# Patient Record
Sex: Male | Born: 1975 | Race: Black or African American | Hispanic: No | Marital: Single | Smoking: Current every day smoker
Health system: Southern US, Community
[De-identification: ages and names within clinical notes are randomized; demographics above are authoritative.]

## PROBLEM LIST (undated history)

## (undated) DIAGNOSIS — I219 Acute myocardial infarction, unspecified: Secondary | ICD-10-CM

## (undated) DIAGNOSIS — I38 Endocarditis, valve unspecified: Secondary | ICD-10-CM

## (undated) DIAGNOSIS — I341 Nonrheumatic mitral (valve) prolapse: Secondary | ICD-10-CM

## (undated) HISTORY — PX: OTHER SURGICAL HISTORY: SHX169

---

## 2014-08-17 ENCOUNTER — Encounter (HOSPITAL_COMMUNITY): Payer: Self-pay | Admitting: Emergency Medicine

## 2014-08-17 ENCOUNTER — Emergency Department (HOSPITAL_COMMUNITY)
Admission: EM | Admit: 2014-08-17 | Discharge: 2014-08-17 | Disposition: A | Discharge status: Home / Self Care | Attending: Emergency Medicine | Admitting: Emergency Medicine

## 2014-08-17 DIAGNOSIS — H608X2 Other otitis externa, left ear: Secondary | ICD-10-CM | POA: Insufficient documentation

## 2014-08-17 DIAGNOSIS — H6502 Acute serous otitis media, left ear: Secondary | ICD-10-CM

## 2014-08-17 DIAGNOSIS — Z8679 Personal history of other diseases of the circulatory system: Secondary | ICD-10-CM | POA: Insufficient documentation

## 2014-08-17 DIAGNOSIS — H9202 Otalgia, left ear: Secondary | ICD-10-CM | POA: Diagnosis present

## 2014-08-17 DIAGNOSIS — H6092 Unspecified otitis externa, left ear: Secondary | ICD-10-CM

## 2014-08-17 HISTORY — DX: Endocarditis, valve unspecified: I38

## 2014-08-17 MED ORDER — IBUPROFEN 800 MG PO TABS: 800.0000 mg | ORAL_TABLET | Freq: Three times a day (TID) | ORAL | Status: DC

## 2014-08-17 MED ORDER — IBUPROFEN 400 MG PO TABS
600.0000 mg | ORAL_TABLET | Freq: Once | ORAL | Status: AC
Start: 2014-08-17 — End: 2014-08-17
  Administered 2014-08-17: 600 mg via ORAL
  Filled 2014-08-17: qty 2

## 2014-08-17 MED ORDER — AMOXICILLIN 250 MG PO CAPS
500.0000 mg | ORAL_CAPSULE | Freq: Once | ORAL | Status: AC
  Administered 2014-08-17: 500 mg via ORAL
  Filled 2014-08-17: qty 2

## 2014-08-17 MED ORDER — AMOXICILLIN 500 MG PO CAPS: 500.0000 mg | ORAL_CAPSULE | Freq: Three times a day (TID) | ORAL | Status: DC

## 2014-08-17 MED ORDER — NEOMYCIN-COLIST-HC-THONZONIUM 3.3-3-10-0.5 MG/ML OT SUSP
4.0000 [drp] | Freq: Four times a day (QID) | OTIC | Status: DC
  Administered 2014-08-17: 4 [drp] via OTIC
  Filled 2014-08-17: qty 5

## 2014-08-17 NOTE — ED Provider Notes (Signed)
CSN: [id number]     Arrival date & time 08/17/14  2035 History   First MD Initiated Contact with Patient 08/17/14 2036     Chief Complaint  Patient presents with  . Otalgia     (Consider location/radiation/quality/duration/timing/severity/associated sxs/prior Treatment) Patient is a 39 y.o. male presenting with ear pain. The history is provided by the patient.  Otalgia Location:  Left Quality:  Aching Severity:  Moderate Onset quality:  Gradual Duration:  1 day Timing:  Constant Progression:  Worsening Chronicity:  New Worsened by:  Nothing tried Ineffective treatments:  None tried  Lisa Blakeman is a 39 y.o. male who presents to the ED with officer from [facility] with complaint of left ear pain that started today.  Past Medical History  Diagnosis Date  . Heart valve disease    Past Surgical History  Procedure Laterality Date  . Head surgery     History reviewed. No pertinent family history. Social History  Substance Use Topics  . Smoking status: Never Smoker   . Smokeless tobacco: None  . Alcohol Use: No    Review of Systems  HENT: Positive for ear pain.   all other systems negative    Allergies  Review of patient's allergies indicates no known allergies.  Home Medications   Prior to Admission medications   Medication Sig Start Date End Date Taking? Authorizing Provider  amoxicillin (AMOXIL) 500 MG capsule Take 1 capsule (500 mg total) by mouth 3 (three) times daily. 08/17/14   Amalie Koran Orlene Och, NP  ibuprofen (ADVIL,MOTRIN) 800 MG tablet Take 1 tablet (800 mg total) by mouth 3 (three) times daily. 08/17/14   Seini Lannom Orlene Och, NP   BP 120/86 mmHg  Pulse 72  Temp(Src) 97.7 F (36.5 C) (Oral)  Resp 14  Ht  (1.753 m)  Wt 175 lb (79.379 kg)  BMI 25.83 kg/m2  SpO2 99% Physical Exam  Constitutional: He is oriented to person, place, and time. He appears well-developed and well-nourished.  HENT:  Head: Normocephalic and atraumatic.  Right Ear:  Tympanic membrane normal.  Left Ear: There is drainage, swelling and tenderness. Tympanic membrane is erythematous.  Nose: Nose normal.  Mouth/Throat: Uvula is midline, oropharynx is clear and moist and mucous membranes are normal.  Eyes: Conjunctivae and EOM are normal. Pupils are equal, round, and reactive to light.  Neck: Neck supple.  Cardiovascular: Normal rate and regular rhythm.   Pulmonary/Chest: Effort normal and breath sounds normal.  Abdominal: Soft. There is no tenderness.  Musculoskeletal: Normal range of motion.  Neurological: He is alert and oriented to person, place, and time. No cranial nerve deficit.  Skin: Skin is warm and dry.  Psychiatric: He has a normal mood and affect. His behavior is normal.  Nursing note and vitals reviewed.   ED Course  Procedures (including critical care time) Labs Review Cortisporin Otic susp. Amoxicillin, Ibuprofen MDM  39 y.o. male with left ear pain that started today. Stable for d/c without fever and does not appear toxic. Will treat with cortisporin otic susp with first dose instilled here in the ED. Also start amoxicillin and ibuprofen he will return as needed.   Final diagnoses:  Acute serous otitis media of left ear, recurrence not specified  Otitis externa, left       [facility], NP 08/17/14 2120  Gilda Crease, MD 08/17/14 2126

## 2014-08-17 NOTE — ED Notes (Signed)
Patient is inmate brought in by [facility] with complaint of pain in left ear starting today.

## 2014-08-17 NOTE — ED Notes (Addendum)
Report given to [facility],  Ear drops given to officer

## 2017-05-14 ENCOUNTER — Ambulatory Visit: Payer: Self-pay | Admitting: Podiatry

## 2017-05-29 ENCOUNTER — Ambulatory Visit: Payer: Self-pay | Admitting: Podiatry

## 2017-05-29 ENCOUNTER — Encounter: Payer: Self-pay | Admitting: Podiatry

## 2017-05-29 VITALS — BP 124/71 | HR 63 | Ht 69.0 in | Wt 175.0 lb

## 2017-05-29 DIAGNOSIS — B351 Tinea unguium: Secondary | ICD-10-CM | POA: Insufficient documentation

## 2017-05-29 DIAGNOSIS — L600 Ingrowing nail: Secondary | ICD-10-CM

## 2017-05-29 NOTE — Progress Notes (Signed)
SUBJECTIVE: 42 y.o. year old male presents complaining of problematic right great toe nails x a year.  Review of Systems  Constitutional: Negative.   HENT: Negative.   Eyes: Negative.   Respiratory: Negative.   Cardiovascular:       Prolapsed Mitral valve.  Gastrointestinal: Negative.   Genitourinary: Negative.   Musculoskeletal: Negative.   Skin: Negative.      OBJECTIVE: DERMATOLOGIC EXAMINATION: Discolored and dystrophic right great toe nail with pain in closed in shoes.  VASCULAR EXAMINATION OF LOWER LIMBS: All pedal pulses are palpable with normal pulsation.  No edema or erythema noted. Temperature gradient from tibial crest to dorsum of foot is within normal bilateral.  NEUROLOGIC EXAMINATION OF THE LOWER LIMBS: All epicritic and tactile sensations grossly intact. Sharp and Dull discriminatory sensations at the plantar ball of hallux is intact bilateral.   MUSCULOSKELETAL EXAMINATION: No gross deformities.  ASSESSMENT: Dystrophic mycotic nail right great toe. Ingrown right great toe nail.  PLAN: Reviewed findings and available treatment options. May benefit from total matrixectomy of right great toe. Patient will return for the procedure.

## 2017-05-29 NOTE — Patient Instructions (Signed)
Seen for painful nail right great toe. Noted of severe damaged and fungal infected great toe. May benefit from total nail matrixectomy of the right great toe. Return for office procedure.

## 2017-05-30 ENCOUNTER — Encounter: Payer: Self-pay | Admitting: Podiatry

## 2017-05-30 NOTE — Addendum Note (Signed)
Addended by: Minna Antis T on: 05/30/2017 10:25 AM   Modules accepted: Orders

## 2017-07-04 ENCOUNTER — Other Ambulatory Visit: Payer: Self-pay

## 2017-07-04 ENCOUNTER — Emergency Department (HOSPITAL_BASED_OUTPATIENT_CLINIC_OR_DEPARTMENT_OTHER)
Admission: EM | Admit: 2017-07-04 | Discharge: 2017-07-04 | Disposition: A | Discharge status: Home / Self Care | Payer: Self-pay | Attending: Emergency Medicine | Admitting: Emergency Medicine

## 2017-07-04 ENCOUNTER — Emergency Department (HOSPITAL_BASED_OUTPATIENT_CLINIC_OR_DEPARTMENT_OTHER): Payer: Self-pay

## 2017-07-04 ENCOUNTER — Encounter (HOSPITAL_BASED_OUTPATIENT_CLINIC_OR_DEPARTMENT_OTHER): Payer: Self-pay | Admitting: Emergency Medicine

## 2017-07-04 DIAGNOSIS — R310 Gross hematuria: Secondary | ICD-10-CM | POA: Insufficient documentation

## 2017-07-04 DIAGNOSIS — R1032 Left lower quadrant pain: Secondary | ICD-10-CM | POA: Insufficient documentation

## 2017-07-04 DIAGNOSIS — N50819 Testicular pain, unspecified: Secondary | ICD-10-CM | POA: Insufficient documentation

## 2017-07-04 DIAGNOSIS — R109 Unspecified abdominal pain: Secondary | ICD-10-CM

## 2017-07-04 LAB — CBC WITH DIFFERENTIAL/PLATELET
Basophils Absolute: 0.1 10*3/uL (ref 0.0–0.1)
Basophils Relative: 1 %
Eosinophils Absolute: 0.2 10*3/uL (ref 0.0–0.7)
Eosinophils Relative: 3 %
HCT: 37.9 % — ABNORMAL LOW (ref 39.0–52.0)
HEMOGLOBIN: 12.7 g/dL — AB (ref 13.0–17.0)
LYMPHS ABS: 3.4 10*3/uL (ref 0.7–4.0)
LYMPHS PCT: 51 %
MCH: 29.1 pg (ref 26.0–34.0)
MCHC: 33.5 g/dL (ref 30.0–36.0)
MCV: 86.7 fL (ref 78.0–100.0)
MONOS PCT: 10 %
Monocytes Absolute: 0.6 10*3/uL (ref 0.1–1.0)
NEUTROS PCT: 37 %
Neutro Abs: 2.4 10*3/uL (ref 1.7–7.7)
Platelets: 231 10*3/uL (ref 150–400)
RBC: 4.37 MIL/uL (ref 4.22–5.81)
RDW: 12.9 % (ref 11.5–15.5)
WBC: 6.7 10*3/uL (ref 4.0–10.5)

## 2017-07-04 LAB — URINALYSIS, MICROSCOPIC (REFLEX)
Bacteria, UA: NONE SEEN
SQUAMOUS EPITHELIAL / LPF: NONE SEEN (ref 0–5)

## 2017-07-04 LAB — COMPREHENSIVE METABOLIC PANEL
ALBUMIN: 3.9 g/dL (ref 3.5–5.0)
ALK PHOS: 47 U/L (ref 38–126)
ALT: 12 U/L (ref 0–44)
AST: 22 U/L (ref 15–41)
Anion gap: 6 (ref 5–15)
BILIRUBIN TOTAL: 0.5 mg/dL (ref 0.3–1.2)
BUN: 14 mg/dL (ref 6–20)
CHLORIDE: 110 mmol/L (ref 98–111)
CO2: 27 mmol/L (ref 22–32)
Calcium: 8.9 mg/dL (ref 8.9–10.3)
Creatinine, Ser: 1.14 mg/dL (ref 0.61–1.24)
GFR calc Af Amer: >60 mL/min (ref >60)
Glucose, Bld: 93 mg/dL (ref 70–99)
POTASSIUM: 4 mmol/L (ref 3.5–5.1)
Sodium: 143 mmol/L (ref 135–145)
Total Protein: 6.9 g/dL (ref 6.5–8.1)

## 2017-07-04 LAB — URINALYSIS, ROUTINE W REFLEX MICROSCOPIC
Bilirubin Urine: NEGATIVE
GLUCOSE, UA: NEGATIVE mg/dL
Ketones, ur: NEGATIVE mg/dL
Leukocytes, UA: NEGATIVE
Nitrite: NEGATIVE
Protein, ur: NEGATIVE mg/dL
pH: 6 (ref 5.0–8.0)

## 2017-07-04 MED ORDER — CEFTRIAXONE SODIUM 250 MG IJ SOLR
250.0000 mg | Freq: Once | INTRAMUSCULAR | Status: AC
  Administered 2017-07-04: 250 mg via INTRAMUSCULAR
  Filled 2017-07-04: qty 250

## 2017-07-04 MED ORDER — TRAMADOL HCL 50 MG PO TABS: 50.0000 mg | ORAL_TABLET | Freq: Four times a day (QID) | ORAL | 0 refills | Status: AC | PRN

## 2017-07-04 MED ORDER — AZITHROMYCIN 250 MG PO TABS
1000.0000 mg | ORAL_TABLET | Freq: Once | ORAL | Status: AC
  Administered 2017-07-04: 1000 mg via ORAL
  Filled 2017-07-04: qty 4

## 2017-07-04 MED ORDER — KETOROLAC TROMETHAMINE 30 MG/ML IJ SOLN
30.0000 mg | Freq: Once | INTRAMUSCULAR | Status: AC
  Administered 2017-07-04: 30 mg via INTRAVENOUS
  Filled 2017-07-04: qty 1

## 2017-07-04 MED ORDER — IBUPROFEN 800 MG PO TABS: 800.0000 mg | ORAL_TABLET | Freq: Three times a day (TID) | ORAL | 0 refills | Status: AC | PRN

## 2017-07-04 NOTE — ED Triage Notes (Signed)
Pt states he is hurting in his private area and about an hour ago he started urinating blood

## 2017-07-04 NOTE — ED Provider Notes (Signed)
Emergency Department Provider Note   I have reviewed the triage vital signs and the nursing notes.   HISTORY  Chief Complaint Hematuria   HPI Edwin Morton is a 42 y.o. male with PMH of valvular disease but no prior h/o kidney stone resents to the emergency department after peeing blood and experiencing pain.  Patient states symptoms began this evening.  He has had intermittent pain in the penis as well as the left abdomen/flank.  He has no prior history of kidney stone.  He denies any fevers or shaking chills.  No prior history of hematuria.  No flulike symptoms.  Denies any urethral discharge.  No chest pain or dyspnea.  No modifying factors.  Pain is severe and intermittent.    Past Medical History:  Diagnosis Date  . Heart valve disease     Patient Active Problem List   Diagnosis Date Noted  . Onychomycosis 05/29/2017    Past Surgical History:  Procedure Laterality Date  . head surgery      Allergies Patient has no known allergies.  Family History  Problem Relation Age of Onset  . Cataracts Mother   . Hypertension Father     Social History Social History   Tobacco Use  . Smoking status: Current Every Day Smoker  . Smokeless tobacco: Never Used  Substance Use Topics  . Alcohol use: No  . Drug use: Yes    Types: Marijuana    Review of Systems  Constitutional: No fever/chills Eyes: No visual changes. ENT: No sore throat. Cardiovascular: Denies chest pain. Respiratory: Denies shortness of breath. Gastrointestinal: Positive LLQ abdominal/flank pain.  No nausea, no vomiting.  No diarrhea.  No constipation. Genitourinary: Negative for dysuria. Positive gross hematuria.  Musculoskeletal: Negative for back pain. Skin: Negative for rash. Neurological: Negative for headaches, focal weakness or numbness.  10-point ROS otherwise negative.  ____________________________________________   PHYSICAL EXAM:  VITAL SIGNS: ED Triage Vitals  Enc Vitals  Group     BP 07/04/17 0127 129/87     Pulse Rate 07/04/17 0127 65     Resp 07/04/17 0127 16     Temp 07/04/17 0127 98.1 F (36.7 C)     Temp Source 07/04/17 0127 Oral     SpO2 07/04/17 0127 100 %     Weight 07/04/17 0128 155 lb (70.3 kg)     Height 07/04/17 0128 5\' 6"  (1.676 m)     Pain Score 07/04/17 0128 8   Constitutional: Alert and oriented. Well appearing and in no acute distress. Eyes: Conjunctivae are normal. Head: Atraumatic. Nose: No congestion/rhinnorhea. Mouth/Throat: Mucous membranes are moist.  Neck: No stridor. Cardiovascular: Normal rate, regular rhythm. Good peripheral circulation. Grossly normal heart sounds.   Respiratory: Normal respiratory effort.  No retractions. Lungs CTAB. Gastrointestinal: Soft with tenderness to palpation over the LLQ. Positive left CVA tenderness. No distention. No urethral discharge. Mild diffuse testicular tenderness. No unilateral tenderness. No masses. No scrotal cellulitis.  Musculoskeletal: No lower extremity tenderness nor edema. No gross deformities of extremities. Neurologic:  Normal speech and language. No gross focal neurologic deficits are appreciated.  Skin:  Skin is warm, dry and intact. No rash noted.  ____________________________________________   LABS (all labs ordered are listed, but only abnormal results are displayed)  Labs Reviewed  URINALYSIS, ROUTINE W REFLEX MICROSCOPIC - Abnormal; Notable for the following components:      Result Value   Specific Gravity, Urine >1.030 (*)    Hgb urine dipstick TRACE (*)  All other components within normal limits  CBC WITH DIFFERENTIAL/PLATELET - Abnormal; Notable for the following components:   Hemoglobin 12.7 (*)    HCT 37.9 (*)    All other components within normal limits  URINALYSIS, MICROSCOPIC (REFLEX)  COMPREHENSIVE METABOLIC PANEL  GC/CHLAMYDIA PROBE AMP ([place]) [facility]   ____________________________________________  RADIOLOGY  Ct Renal Stone  Study  Result Date: 07/04/2017 CLINICAL DATA:  42 y/o  M; Flank pain, stone disease suspected. EXAM: CT ABDOMEN AND PELVIS WITHOUT CONTRAST TECHNIQUE: Multidetector CT imaging of the abdomen and pelvis was performed following the standard protocol without IV contrast. COMPARISON:  None. FINDINGS: Lower chest: No acute abnormality. Hepatobiliary: No focal liver abnormality is seen. No gallstones, gallbladder wall thickening, or biliary dilatation. Pancreas: Unremarkable. No pancreatic ductal dilatation or surrounding inflammatory changes. Spleen: Normal in size without focal abnormality. Adrenals/Urinary Tract: Adrenal glands are unremarkable. Kidneys are normal, without renal calculi, focal lesion, or hydronephrosis. Bladder is unremarkable. Stomach/Bowel: Stomach is within normal limits. Appendix appears normal. No evidence of bowel wall thickening, distention, or inflammatory changes. Vascular/Lymphatic: No significant vascular findings are present. No enlarged abdominal or pelvic lymph nodes. Reproductive: Prostate is unremarkable. Other: No abdominal wall hernia or abnormality. No abdominopelvic ascites. Musculoskeletal: No acute or significant osseous findings. IMPRESSION: No acute process identified. Unremarkable CT of the abdomen and pelvis. Electronically Signed   By: Mitzi HansenLance  Furusawa-Stratton M.D.   On: 07/04/2017 02:37    ____________________________________________   PROCEDURES  Procedure(s) performed:   Procedures  None ____________________________________________   INITIAL IMPRESSION / ASSESSMENT AND PLAN / ED COURSE  Pertinent labs & imaging results that were available during my care of the patient were reviewed by me and considered in my medical decision making (see chart for details).  Patient describes pain in the urethra as well as left flank with associated hematuria.  Symptoms are severe and intermittent.  Clinical suspicion for nephrolithiasis is increased.  UA shows no  evidence of infection but does show hemoglobin.  Plan for Toradol, labs, CT renal, reassess.   CT reviewed with no acute findings. Labs largely unremarkable. Patient now describes some concern for possible STD. He notes blood in the semen with ejaculation in addition to blood in the urine. Plan to send gonorrhea/chlamydia testing and treat empirically here. Provided contact information for Urology and placed an ambulatory referral from the ED. No concern clinically for torsion after exam.   At this time, I do not feel there is any life-threatening condition present. I have reviewed and discussed all results (EKG, imaging, lab, urine as appropriate), exam findings with patient. I have reviewed nursing notes and appropriate previous records.  I feel the patient is safe to be discharged home without further emergent workup. Discussed usual and customary return precautions. Patient and family (if present) verbalize understanding and are comfortable with this plan.  Patient will follow-up with their primary care provider. If they do not have a primary care provider, information for follow-up has been provided to them. All questions have been answered.  ____________________________________________  FINAL CLINICAL IMPRESSION(S) / ED DIAGNOSES  Final diagnoses:  Gross hematuria  Left flank pain     MEDICATIONS GIVEN DURING THIS VISIT:  Medications  ketorolac (TORADOL) 30 MG/ML injection 30 mg (30 mg Intravenous Given 07/04/17 0215)  azithromycin (ZITHROMAX) tablet 1,000 mg (1,000 mg Oral Given 07/04/17 0306)  cefTRIAXone (ROCEPHIN) injection 250 mg (250 mg Intramuscular Given 07/04/17 0307)     NEW OUTPATIENT MEDICATIONS STARTED DURING THIS VISIT:  New Prescriptions   IBUPROFEN (ADVIL,MOTRIN) 800 MG TABLET    Take 1 tablet (800 mg total) by mouth every 8 (eight) hours as needed.   TRAMADOL (ULTRAM) 50 MG TABLET    Take 1 tablet (50 mg total) by mouth every 6 (six) hours as needed.    Note:  This  document was prepared using Dragon voice recognition software and may include unintentional dictation errors.  Alona Bene, MD Emergency Medicine    Jaelene Garciagarcia, Arlyss Repress, MD 07/04/17 [phone]

## 2017-07-04 NOTE — Discharge Instructions (Signed)
You were seen in the ED today with blood in the urine. We did not find any evidence of infection or kidney stone. Take Motrin as needed for pain. Do not drive while taking Tramadol. Call the Urologist listed to schedule the soonest available follow up appointment. I have placed a referral for you. Return to the ED with any sudden worsening pain, fever, or chills.

## 2017-07-05 LAB — GC/CHLAMYDIA PROBE AMP (~~LOC~~) NOT AT ARMC
CHLAMYDIA, DNA PROBE: NEGATIVE
NEISSERIA GONORRHEA: NEGATIVE

## 2018-01-05 ENCOUNTER — Emergency Department (HOSPITAL_BASED_OUTPATIENT_CLINIC_OR_DEPARTMENT_OTHER)
Admission: EM | Admit: 2018-01-05 | Discharge: 2018-01-05 | Disposition: A | Discharge status: Home / Self Care | Payer: Self-pay | Attending: Emergency Medicine | Admitting: Emergency Medicine

## 2018-01-05 ENCOUNTER — Encounter (HOSPITAL_BASED_OUTPATIENT_CLINIC_OR_DEPARTMENT_OTHER): Payer: Self-pay | Admitting: *Deleted

## 2018-01-05 ENCOUNTER — Emergency Department (HOSPITAL_BASED_OUTPATIENT_CLINIC_OR_DEPARTMENT_OTHER): Payer: Self-pay

## 2018-01-05 ENCOUNTER — Other Ambulatory Visit: Payer: Self-pay

## 2018-01-05 DIAGNOSIS — F17210 Nicotine dependence, cigarettes, uncomplicated: Secondary | ICD-10-CM | POA: Insufficient documentation

## 2018-01-05 DIAGNOSIS — I252 Old myocardial infarction: Secondary | ICD-10-CM | POA: Insufficient documentation

## 2018-01-05 DIAGNOSIS — Z79899 Other long term (current) drug therapy: Secondary | ICD-10-CM | POA: Insufficient documentation

## 2018-01-05 DIAGNOSIS — R112 Nausea with vomiting, unspecified: Secondary | ICD-10-CM | POA: Insufficient documentation

## 2018-01-05 DIAGNOSIS — R4189 Other symptoms and signs involving cognitive functions and awareness: Secondary | ICD-10-CM | POA: Insufficient documentation

## 2018-01-05 HISTORY — DX: Acute myocardial infarction, unspecified: I21.9

## 2018-01-05 HISTORY — DX: Nonrheumatic mitral (valve) prolapse: I34.1

## 2018-01-05 LAB — TROPONIN I: Troponin I: <0.03 ng/mL (ref <0.03)

## 2018-01-05 LAB — COMPREHENSIVE METABOLIC PANEL
ALK PHOS: 51 U/L (ref 38–126)
ALT: 16 U/L (ref 0–44)
AST: 29 U/L (ref 15–41)
Albumin: 5.2 g/dL — ABNORMAL HIGH (ref 3.5–5.0)
Anion gap: 10 (ref 5–15)
BUN: 15 mg/dL (ref 6–20)
CALCIUM: 10.4 mg/dL — AB (ref 8.9–10.3)
CO2: 28 mmol/L (ref 22–32)
CREATININE: 0.97 mg/dL (ref 0.61–1.24)
Chloride: 98 mmol/L (ref 98–111)
GFR calc non Af Amer: >60 mL/min (ref >60)
GLUCOSE: 112 mg/dL — AB (ref 70–99)
Potassium: 4.2 mmol/L (ref 3.5–5.1)
SODIUM: 136 mmol/L (ref 135–145)
Total Bilirubin: 0.9 mg/dL (ref 0.3–1.2)
Total Protein: 9.1 g/dL — ABNORMAL HIGH (ref 6.5–8.1)

## 2018-01-05 LAB — URINALYSIS, ROUTINE W REFLEX MICROSCOPIC
Glucose, UA: NEGATIVE mg/dL
Hgb urine dipstick: NEGATIVE
Ketones, ur: 15 mg/dL — AB
Leukocytes, UA: NEGATIVE
Nitrite: NEGATIVE
PROTEIN: NEGATIVE mg/dL
Specific Gravity, Urine: >1.03 — ABNORMAL HIGH (ref 1.005–1.030)
pH: 5.5 (ref 5.0–8.0)

## 2018-01-05 LAB — CBC
HCT: 47.8 % (ref 39.0–52.0)
Hemoglobin: 14.8 g/dL (ref 13.0–17.0)
MCH: 27.8 pg (ref 26.0–34.0)
MCHC: 31 g/dL (ref 30.0–36.0)
MCV: 89.7 fL (ref 80.0–100.0)
NRBC: 0 % (ref 0.0–0.2)
PLATELETS: 265 10*3/uL (ref 150–400)
RBC: 5.33 MIL/uL (ref 4.22–5.81)
RDW: 13.2 % (ref 11.5–15.5)
WBC: 11.6 10*3/uL — ABNORMAL HIGH (ref 4.0–10.5)

## 2018-01-05 LAB — CBG MONITORING, ED: Glucose-Capillary: 120 mg/dL — ABNORMAL HIGH (ref 70–99)

## 2018-01-05 LAB — LIPASE, BLOOD: Lipase: 28 U/L (ref 11–51)

## 2018-01-05 LAB — RAPID URINE DRUG SCREEN, HOSP PERFORMED
Amphetamines: NOT DETECTED
Barbiturates: NOT DETECTED
Benzodiazepines: NOT DETECTED
Cocaine: NOT DETECTED
Opiates: NOT DETECTED
Tetrahydrocannabinol: POSITIVE — AB

## 2018-01-05 MED ORDER — ONDANSETRON HCL 4 MG/2ML IJ SOLN
4.0000 mg | Freq: Once | INTRAMUSCULAR | Status: AC
  Administered 2018-01-05: 4 mg via INTRAVENOUS
  Filled 2018-01-05: qty 2

## 2018-01-05 MED ORDER — ALUM & MAG HYDROXIDE-SIMETH 200-200-20 MG/5ML PO SUSP
30.0000 mL | Freq: Once | ORAL | Status: AC
  Administered 2018-01-05: 30 mL via ORAL
  Filled 2018-01-05: qty 30

## 2018-01-05 MED ORDER — ONDANSETRON HCL 4 MG PO TABS: 4.0000 mg | ORAL_TABLET | Freq: Four times a day (QID) | ORAL | 0 refills | Status: AC

## 2018-01-05 MED ORDER — PANTOPRAZOLE SODIUM 20 MG PO TBEC: 20.0000 mg | DELAYED_RELEASE_TABLET | Freq: Every day | ORAL | 0 refills | Status: AC

## 2018-01-05 MED ORDER — FAMOTIDINE IN NACL 20-0.9 MG/50ML-% IV SOLN
20.0000 mg | Freq: Once | INTRAVENOUS | Status: AC
  Administered 2018-01-05: 20 mg via INTRAVENOUS
  Filled 2018-01-05: qty 50

## 2018-01-05 MED ORDER — SODIUM CHLORIDE 0.9 % IV BOLUS
1000.0000 mL | Freq: Once | INTRAVENOUS | Status: AC
  Administered 2018-01-05: 1000 mL via INTRAVENOUS

## 2018-01-05 MED ORDER — SUCRALFATE 1 G PO TABS: 1.0000 g | ORAL_TABLET | Freq: Three times a day (TID) | ORAL | 0 refills | Status: AC

## 2018-01-05 NOTE — Discharge Instructions (Signed)
Please take medication as directed.  Please follow up with your primary doctor within the next 5-7 days.  IPlease return to the ER sooner if you have any new or worsening symptoms, or if you have any of the following symptoms:  Abdominal pain that does not go away.  You have a fever.  You keep throwing up (vomiting).  The pain is felt only in portions of the abdomen. Pain in the right side could possibly be appendicitis. In an adult, pain in the left lower portion of the abdomen could be colitis or diverticulitis.  You pass bloody or black tarry stools.  There is bright red blood in the stool.  The constipation stays for more than 4 days.  There is belly (abdominal) or rectal pain.  You do not seem to be getting better.  You have any questions or concerns.

## 2018-01-05 NOTE — ED Notes (Signed)
Patient transported to CT 
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
## 2018-01-05 NOTE — ED Notes (Signed)
Pt given gingerale to drink for PO challenge. Pt eating sandwich his wife has in room

## 2020-06-20 IMAGING — DX DG CHEST 2V
2 series · 2 of 2 positions shown · non-contrast
Comparison: 09/18/2015

CLINICAL DATA: Nausea vomiting and sweating since last evening

EXAM:
CHEST - 2 VIEW

[chest pa]
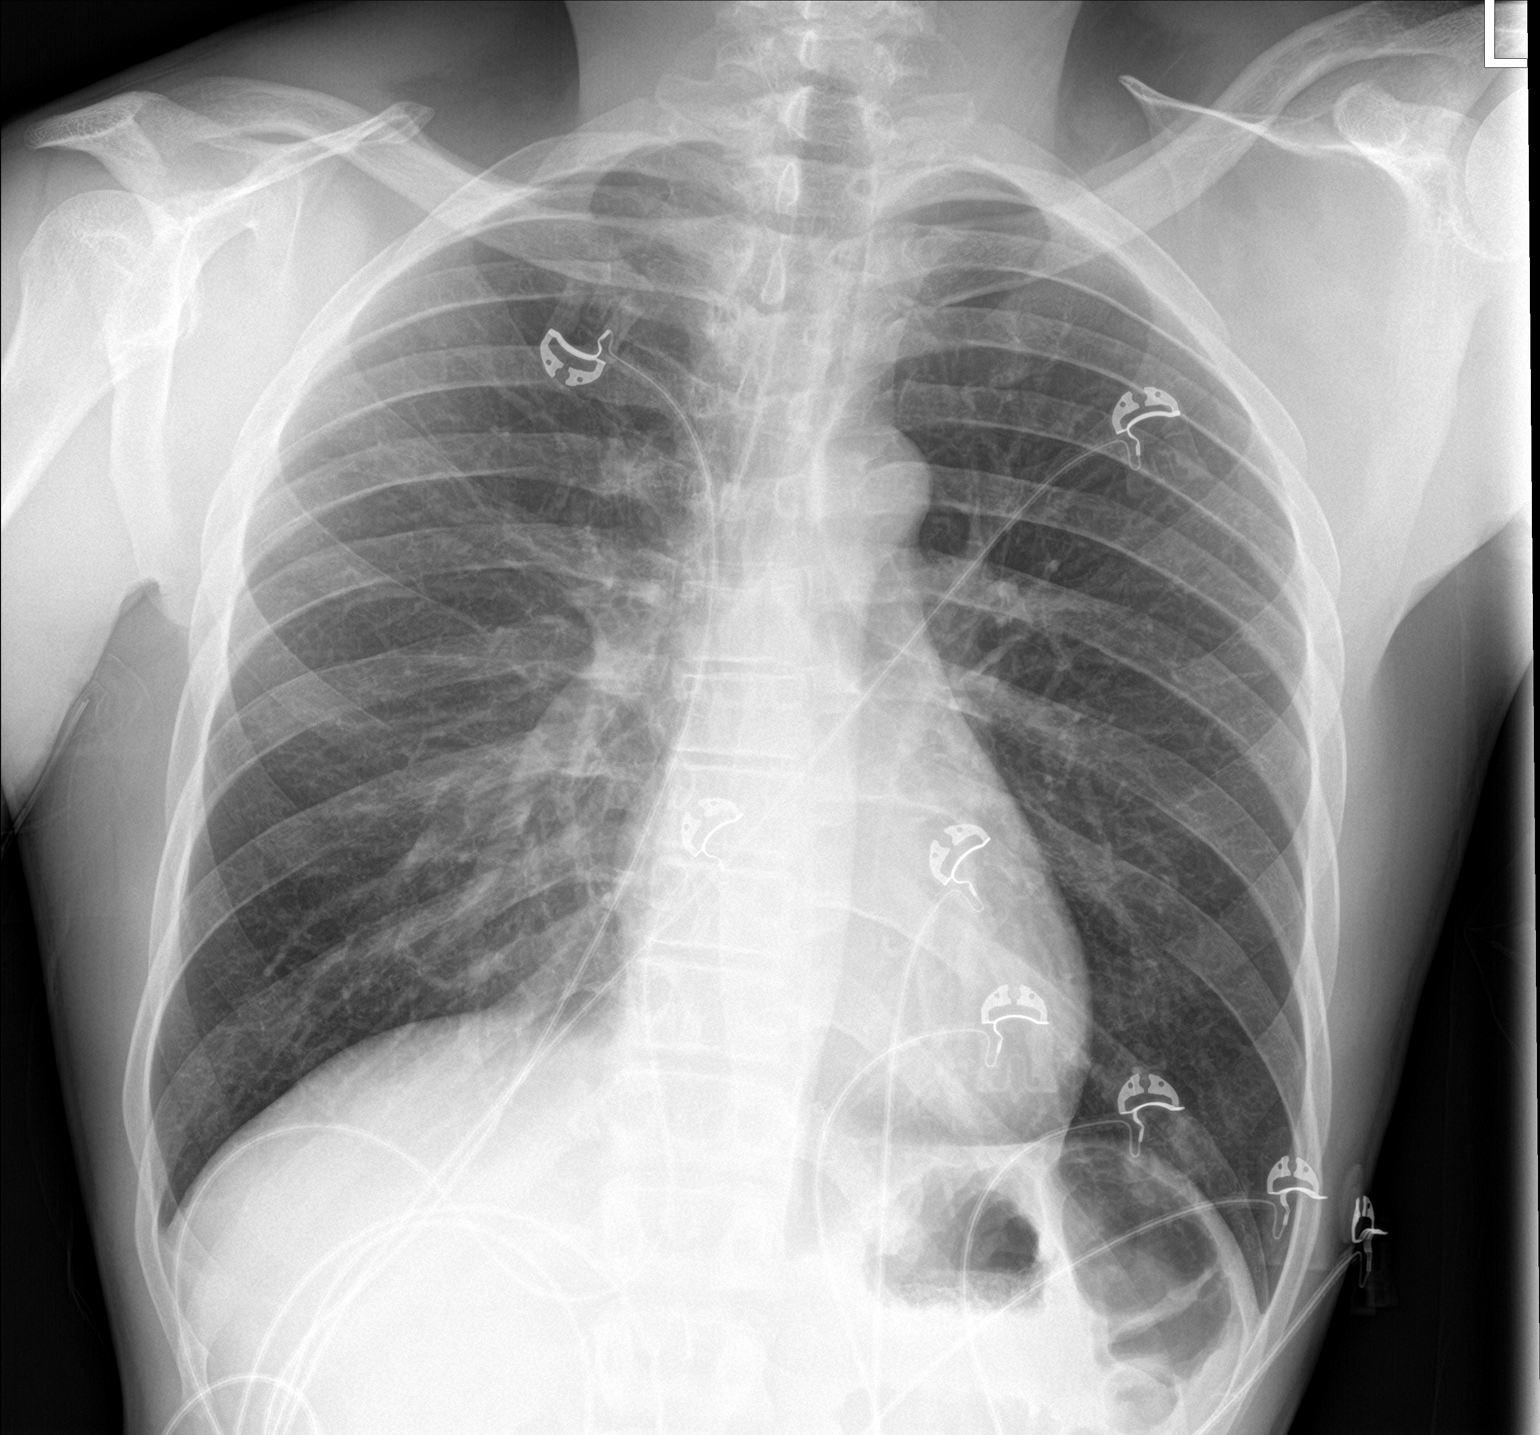

[chest lat]
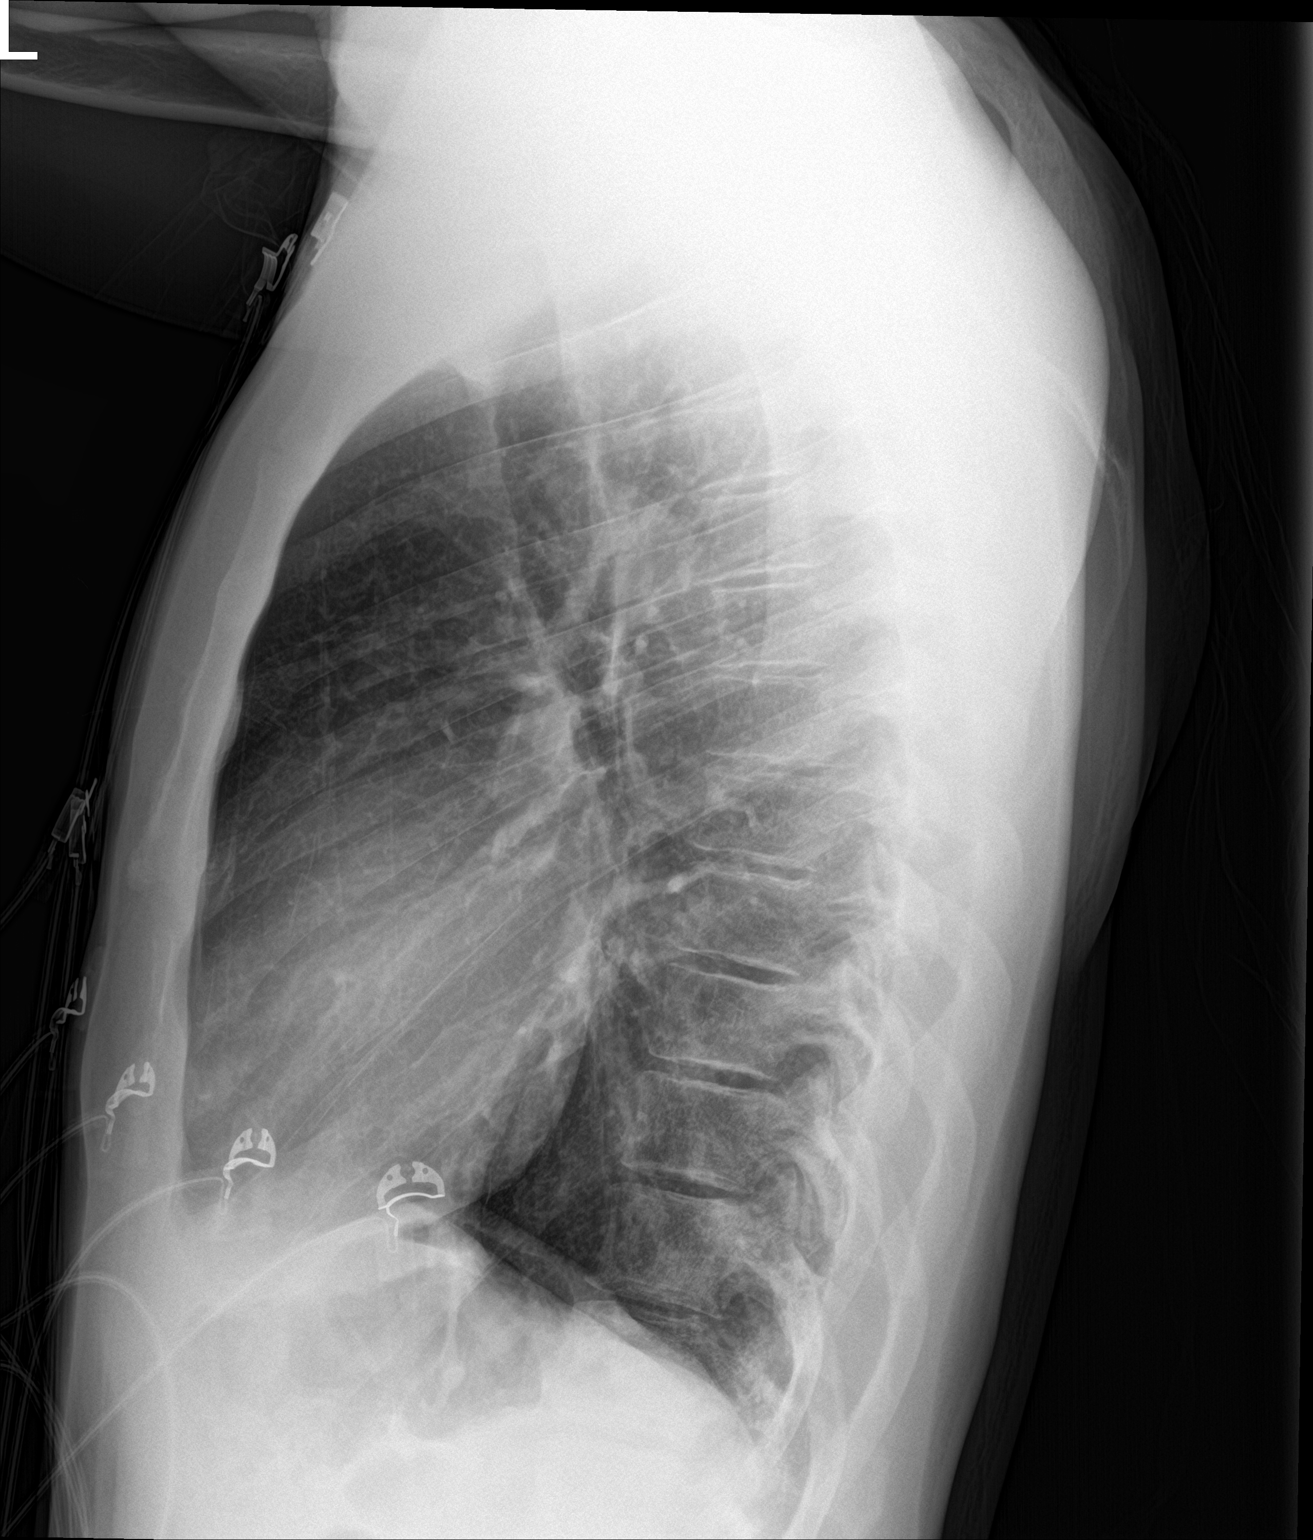

[2 of 2 positions shown; findings below may reference images not displayed]

FINDINGS: The heart size and mediastinal contours are within normal limits.
Both lungs are clear. The visualized skeletal structures are
unremarkable.
IMPRESSION: No active cardiopulmonary disease.

## 2020-11-26 ENCOUNTER — Emergency Department (HOSPITAL_BASED_OUTPATIENT_CLINIC_OR_DEPARTMENT_OTHER)
Admission: EM | Admit: 2020-11-26 | Discharge: 2020-11-27 | Disposition: A | Discharge status: Home / Self Care | Payer: Self-pay | Attending: Emergency Medicine | Admitting: Emergency Medicine

## 2020-11-26 ENCOUNTER — Other Ambulatory Visit: Payer: Self-pay

## 2020-11-26 ENCOUNTER — Encounter (HOSPITAL_BASED_OUTPATIENT_CLINIC_OR_DEPARTMENT_OTHER): Payer: Self-pay | Admitting: *Deleted

## 2020-11-26 DIAGNOSIS — F17210 Nicotine dependence, cigarettes, uncomplicated: Secondary | ICD-10-CM | POA: Insufficient documentation

## 2020-11-26 DIAGNOSIS — U071 COVID-19: Secondary | ICD-10-CM | POA: Insufficient documentation

## 2020-11-26 LAB — RESP PANEL BY RT-PCR (FLU A&B, COVID) ARPGX2
Influenza A by PCR: NEGATIVE
Influenza B by PCR: NEGATIVE
SARS Coronavirus 2 by RT PCR: POSITIVE — AB

## 2020-11-26 NOTE — ED Triage Notes (Signed)
Pt reports fever and bodyaches since wednesday

## 2020-11-27 LAB — BASIC METABOLIC PANEL
Anion gap: 7 (ref 5–15)
BUN: 9 mg/dL (ref 6–20)
CO2: 26 mmol/L (ref 22–32)
Calcium: 8.4 mg/dL — ABNORMAL LOW (ref 8.9–10.3)
Chloride: 104 mmol/L (ref 98–111)
Creatinine, Ser: 1.18 mg/dL (ref 0.61–1.24)
GFR, Estimated: >60 mL/min (ref >60)
Glucose, Bld: 93 mg/dL (ref 70–99)
Potassium: 3.4 mmol/L — ABNORMAL LOW (ref 3.5–5.1)
Sodium: 137 mmol/L (ref 135–145)

## 2020-11-27 MED ORDER — ACETAMINOPHEN 500 MG PO TABS
1000.0000 mg | ORAL_TABLET | Freq: Once | ORAL | Status: AC
  Administered 2020-11-27: 02:00:00 1000 mg via ORAL
  Filled 2020-11-27: qty 2

## 2020-11-27 MED ORDER — NIRMATRELVIR/RITONAVIR (PAXLOVID)TABLET: 3.0000 | ORAL_TABLET | Freq: Two times a day (BID) | ORAL | 0 refills | Status: AC

## 2020-11-27 NOTE — ED Provider Notes (Signed)
[facility] EMERGENCY DEPARTMENT Provider Note   CSN: [id number] Arrival date & time: 11/26/20  2208     History Chief Complaint  Patient presents with   Fever    Edwin Morton is a 45 y.o. male.  Fever, body aches and congestion for the last few days.  No known sick contacts.  Patient is concerned he might have the flu.  He does have a history of smoking.  No other lung disease..  No other medical problems.  No nausea, vomiting or other associated symptoms.   Fever     Past Medical History:  Diagnosis Date   Heart attack (HCC)    Heart valve disease    Mitral valve prolapse     Patient Active Problem List   Diagnosis Date Noted   Onychomycosis 05/29/2017    Past Surgical History:  Procedure Laterality Date   fatty tumor removed     head surgery         Family History  Problem Relation Age of Onset   Cataracts Mother    Hypertension Father     Social History   Tobacco Use   Smoking status: Every Day    Types: Cigarettes   Smokeless tobacco: Never  Vaping Use   Vaping Use: Never used  Substance Use Topics   Alcohol use: Not Currently    Comment: beer 1x week   Drug use: Not Currently    Types: Marijuana    Home Medications Prior to Admission medications   Medication Sig Start Date End Date Taking? Authorizing Provider  nirmatrelvir/ritonavir EUA (PAXLOVID) 20 x 150 MG & 10 x 100MG  TABS Take 3 tablets by mouth 2 (two) times daily for 5 days. Patient GFR is >60. Take nirmatrelvir (150 mg) two tablets twice daily for 5 days and ritonavir (100 mg) one tablet twice daily for 5 days. 11/27/20 12/02/20 Yes Shalita Notte, 14/2/22, MD  ibuprofen (ADVIL,MOTRIN) 800 MG tablet Take 1 tablet (800 mg total) by mouth every 8 (eight) hours as needed. 07/04/17   Long, 09/04/17, MD  ondansetron (ZOFRAN) 4 MG tablet Take 1 tablet (4 mg total) by mouth every 6 (six) hours. 01/05/18   Couture, Cortni S, PA-C  pantoprazole (PROTONIX) 20 MG tablet Take 1 tablet (20 mg  total) by mouth daily for 7 days. 01/05/18 01/12/18  Couture, Cortni S, PA-C  sucralfate (CARAFATE) 1 g tablet Take 1 tablet (1 g total) by mouth 3 (three) times daily with meals for 7 days. 01/05/18 01/12/18  Couture, Cortni S, PA-C  traMADol (ULTRAM) 50 MG tablet Take 1 tablet (50 mg total) by mouth every 6 (six) hours as needed. 07/04/17   Long, 09/04/17, MD    Allergies    Patient has no known allergies.  Review of Systems   Review of Systems  Constitutional:  Positive for fever.  All other systems reviewed and are negative.  Physical Exam Updated Vital Signs BP (!) 136/92 (BP Location: Left Arm)   Pulse 80   Temp 99.2 F (37.3 C) (Oral)   Resp 18   Ht 5\' 6"  (1.676 m)   Wt 65.8 kg   SpO2 100%   BMI 23.40 kg/m   Physical Exam Vitals and nursing note reviewed.  Constitutional:      Appearance: He is well-developed.  HENT:     Head: Normocephalic and atraumatic.     Mouth/Throat:     Mouth: Mucous membranes are moist.     Pharynx: Oropharynx is clear.  Eyes:  Pupils: Pupils are equal, round, and reactive to light.  Cardiovascular:     Rate and Rhythm: Normal rate.  Pulmonary:     Effort: Pulmonary effort is normal. No respiratory distress.     Breath sounds: Wheezing present.  Abdominal:     General: Abdomen is flat. There is no distension.  Musculoskeletal:        General: Normal range of motion.     Cervical back: Normal range of motion.  Skin:    General: Skin is warm and dry.     Coloration: Skin is not jaundiced or pale.  Neurological:     General: No focal deficit present.     Mental Status: He is alert.    ED Results / Procedures / Treatments   Labs (all labs ordered are listed, but only abnormal results are displayed) Labs Reviewed  RESP PANEL BY RT-PCR (FLU A&B, COVID) ARPGX2 - Abnormal; Notable for the following components:      Result Value   SARS Coronavirus 2 by RT PCR POSITIVE (*)    All other components within normal limits  BASIC METABOLIC  PANEL - Abnormal; Notable for the following components:   Potassium 3.4 (*)    Calcium 8.4 (*)    All other components within normal limits    EKG None  Radiology No results found.  Procedures Procedures   Medications Ordered in ED Medications  acetaminophen (TYLENOL) tablet 1,000 mg (has no administration in time range)    ED Course  I have reviewed the triage vital signs and the nursing notes.  Pertinent labs & imaging results that were available during my care of the patient were reviewed by me and considered in my medical decision making (see chart for details).    MDM Rules/Calculators/A&P                         Patient found to have COVID.  Overall appears well but with his smoking and likely early COPD we will go ahead and treat with Paxlovid.  Renal function greater than 60.  Plan for discharge with strict return precautions  Final Clinical Impression(s) / ED Diagnoses Final diagnoses:  COVID-19    Rx / DC Orders ED Discharge Orders          Ordered    nirmatrelvir/ritonavir EUA (PAXLOVID) 20 x 150 MG & 10 x 100MG  TABS  2 times daily        11/27/20 0141             Myreon Wimer, 11/29/20, MD 11/27/20 11/29/20

## 2020-11-27 NOTE — ED Notes (Signed)
Patient discharged to home.  All discharge instructions reviewed.  Patient verbalized understanding via teachback method.  VS WDL.  Respirations even and unlabored.  Ambulatory out of ED.   °
